# Patient Record
Sex: Male | Born: 1990 | Race: Black or African American | Hispanic: No | Marital: Single | Smoking: Never smoker
Health system: Southern US, Community
[De-identification: ages and names within clinical notes are randomized; demographics above are authoritative.]

## PROBLEM LIST (undated history)

## (undated) DIAGNOSIS — F419 Anxiety disorder, unspecified: Secondary | ICD-10-CM

## (undated) HISTORY — PX: NO PAST SURGERIES: SHX2092

---
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
## 2008-04-03 ENCOUNTER — Emergency Department: Payer: Self-pay

## 2008-06-25 ENCOUNTER — Emergency Department: Payer: Self-pay | Admitting: Emergency Medicine

## 2008-07-02 ENCOUNTER — Emergency Department: Payer: Self-pay | Admitting: Emergency Medicine

## 2009-03-22 ENCOUNTER — Emergency Department: Payer: Self-pay | Admitting: Emergency Medicine

## 2009-03-22 ENCOUNTER — Emergency Department: Payer: Self-pay | Admitting: Unknown Physician Specialty

## 2010-03-23 ENCOUNTER — Emergency Department: Payer: Self-pay | Admitting: Emergency Medicine

## 2011-10-29 ENCOUNTER — Emergency Department: Payer: Self-pay | Admitting: Emergency Medicine

## 2013-12-09 ENCOUNTER — Emergency Department: Payer: Self-pay | Admitting: Emergency Medicine

## 2013-12-24 ENCOUNTER — Emergency Department: Payer: Self-pay | Admitting: Emergency Medicine

## 2014-02-16 ENCOUNTER — Emergency Department: Payer: Self-pay | Admitting: Emergency Medicine

## 2015-06-07 ENCOUNTER — Encounter: Payer: Self-pay | Admitting: *Deleted

## 2015-06-07 ENCOUNTER — Ambulatory Visit
Admission: EM | Admit: 2015-06-07 | Discharge: 2015-06-07 | Disposition: A | Discharge status: Home / Self Care | Payer: Self-pay | Attending: Family Medicine | Admitting: Family Medicine

## 2015-06-07 ENCOUNTER — Emergency Department
Admission: EM | Admit: 2015-06-07 | Discharge: 2015-06-07 | Discharge status: Against Medical Advice | Payer: Self-pay | Attending: Emergency Medicine | Admitting: Emergency Medicine

## 2015-06-07 DIAGNOSIS — J039 Acute tonsillitis, unspecified: Secondary | ICD-10-CM

## 2015-06-07 DIAGNOSIS — R1310 Dysphagia, unspecified: Secondary | ICD-10-CM | POA: Insufficient documentation

## 2015-06-07 DIAGNOSIS — R0989 Other specified symptoms and signs involving the circulatory and respiratory systems: Secondary | ICD-10-CM | POA: Insufficient documentation

## 2015-06-07 DIAGNOSIS — H6592 Unspecified nonsuppurative otitis media, left ear: Secondary | ICD-10-CM

## 2015-06-07 DIAGNOSIS — H66001 Acute suppurative otitis media without spontaneous rupture of ear drum, right ear: Secondary | ICD-10-CM

## 2015-06-07 LAB — CBC WITH DIFFERENTIAL/PLATELET
BASOS ABS: 0.1 10*3/uL (ref 0–0.1)
BASOS PCT: 1 %
EOS ABS: 0 10*3/uL (ref 0–0.7)
Eosinophils Relative: 0 %
HEMATOCRIT: 48.7 % (ref 40.0–52.0)
HEMOGLOBIN: 16.3 g/dL (ref 13.0–18.0)
Lymphocytes Relative: 10 %
Lymphs Abs: 1.7 10*3/uL (ref 1.0–3.6)
MCH: 26.9 pg (ref 26.0–34.0)
MCHC: 33.5 g/dL (ref 32.0–36.0)
MCV: 80.4 fL (ref 80.0–100.0)
Monocytes Absolute: 1.5 10*3/uL — ABNORMAL HIGH (ref 0.2–1.0)
Monocytes Relative: 9 %
NEUTROS ABS: 14.3 10*3/uL — AB (ref 1.4–6.5)
NEUTROS PCT: 80 %
Platelets: 258 10*3/uL (ref 150–440)
RBC: 6.05 MIL/uL — AB (ref 4.40–5.90)
RDW: 13.2 % (ref 11.5–14.5)
WBC: 17.7 10*3/uL — AB (ref 3.8–10.6)

## 2015-06-07 LAB — MONONUCLEOSIS SCREEN: MONO SCREEN: NEGATIVE

## 2015-06-07 LAB — RAPID STREP SCREEN (MED CTR MEBANE ONLY): STREPTOCOCCUS, GROUP A SCREEN (DIRECT): NEGATIVE

## 2015-06-07 MED ORDER — AMOXICILLIN-POT CLAVULANATE 400-57 MG/5ML PO SUSR: 875.0000 mg | Freq: Two times a day (BID) | ORAL | Status: AC

## 2015-06-07 MED ORDER — IBUPROFEN 800 MG PO TABS: 800.0000 mg | ORAL_TABLET | Freq: Three times a day (TID) | ORAL | Status: AC | PRN

## 2015-06-07 MED ORDER — PREDNISONE 50 MG PO TABS: 50.0000 mg | ORAL_TABLET | Freq: Every day | ORAL | Status: DC

## 2015-06-07 MED ORDER — DIPHENHYDRAMINE HCL 12.5 MG/5ML PO SYRP: 25.0000 mg | ORAL_SOLUTION | Freq: Four times a day (QID) | ORAL | Status: AC | PRN

## 2015-06-07 MED ORDER — DIPHENHYDRAMINE HCL 12.5 MG/5ML PO ELIX
25.0000 mg | ORAL_SOLUTION | Freq: Once | ORAL | Status: AC
  Administered 2015-06-07: 25 mg via ORAL

## 2015-06-07 MED ORDER — METHYLPREDNISOLONE SODIUM SUCC 125 MG IJ SOLR
125.0000 mg | Freq: Once | INTRAMUSCULAR | Status: AC
  Administered 2015-06-07: 125 mg via INTRAMUSCULAR

## 2015-06-07 MED ORDER — PREDNISONE 50 MG PO TABS: 50.0000 mg | ORAL_TABLET | Freq: Every day | ORAL | Status: AC

## 2015-06-07 MED ORDER — ACETAMINOPHEN 500 MG PO TABS: 1000.0000 mg | ORAL_TABLET | Freq: Four times a day (QID) | ORAL | Status: AC | PRN

## 2015-06-07 MED ORDER — IBUPROFEN 800 MG PO TABS: 800.0000 mg | ORAL_TABLET | Freq: Three times a day (TID) | ORAL | Status: DC | PRN

## 2015-06-07 MED ORDER — ACETAMINOPHEN 500 MG PO TABS: 1000.0000 mg | ORAL_TABLET | Freq: Four times a day (QID) | ORAL | Status: DC | PRN

## 2015-06-07 MED ORDER — AMOXICILLIN-POT CLAVULANATE 400-57 MG/5ML PO SUSR: 875.0000 mg | Freq: Two times a day (BID) | ORAL | Status: DC

## 2015-06-07 MED ORDER — IBUPROFEN 100 MG/5ML PO SUSP
800.0000 mg | Freq: Once | ORAL | Status: AC
  Administered 2015-06-07: 800 mg via ORAL

## 2015-06-07 MED ORDER — DIPHENHYDRAMINE HCL 12.5 MG/5ML PO SYRP: 25.0000 mg | ORAL_SOLUTION | Freq: Four times a day (QID) | ORAL | Status: DC | PRN

## 2015-06-07 NOTE — Discharge Instructions (Signed)
Otitis Media With Effusion Otitis media with effusion is the presence of fluid in the middle ear. This is a common problem in children, which often follows ear infections. It may be present for weeks or longer after the infection. Unlike an acute ear infection, otitis media with effusion refers only to fluid behind the ear drum and not infection. Children with repeated ear and sinus infections and allergy problems are the most likely to get otitis media with effusion. CAUSES  The most frequent cause of the fluid buildup is dysfunction of the eustachian tubes. These are the tubes that drain fluid in the ears to the back of the nose (nasopharynx). SYMPTOMS   The main symptom of this condition is hearing loss. As a result, you or your child may:  Listen to the TV at a loud volume.  Not respond to questions.  Ask "what" often when spoken to.  Mistake or confuse one sound or word for another.  There may be a sensation of fullness or pressure but usually not pain. DIAGNOSIS   Your health care provider will diagnose this condition by examining you or your child's ears.  Your health care provider may test the pressure in you or your child's ear with a tympanometer.  A hearing test may be conducted if the problem persists. TREATMENT   Treatment depends on the duration and the effects of the effusion.  Antibiotics, decongestants, nose drops, and cortisone-type drugs (tablets or nasal spray) may not be helpful.  Children with persistent ear effusions may have delayed language or behavioral problems. Children at risk for developmental delays in hearing, learning, and speech may require referral to a specialist earlier than children not at risk.  You or your child's health care provider may suggest a referral to an ear, nose, and throat surgeon for treatment. The following may help restore normal hearing:  Drainage of fluid.  Placement of ear tubes (tympanostomy tubes).  Removal of adenoids  (adenoidectomy). HOME CARE INSTRUCTIONS   Avoid secondhand smoke.  Infants who are breastfed are less likely to have this condition.  Avoid feeding infants while they are lying flat.  Avoid known environmental allergens.  Avoid people who are sick. SEEK MEDICAL CARE IF:   Hearing is not better in 3 months.  Hearing is worse.  Ear pain.  Drainage from the ear.  Dizziness. MAKE SURE YOU:   Understand these instructions.  Will watch your condition.  Will get help right away if you are not doing well or get worse.   This information is not intended to replace advice given to you by your health care provider. Make sure you discuss any questions you have with your health care provider.   Document Released: 08/18/2004 Document Revised: 08/01/2014 Document Reviewed: 02/05/2013 Elsevier Interactive Patient Education 2016 [employer] Otitis Media With Effusion Otitis media with effusion is the presence of fluid in the middle ear. This is a common problem in children, which often follows ear infections. It may be present for weeks or longer after the infection. Unlike an acute ear infection, otitis media with effusion refers only to fluid behind the ear drum and not infection. Children with repeated ear and sinus infections and allergy problems are the most likely to get otitis media with effusion. CAUSES  The most frequent cause of the fluid buildup is dysfunction of the eustachian tubes. These are the tubes that drain fluid in the ears to the back of the nose (nasopharynx). SYMPTOMS   The main symptom of  this condition is hearing loss. As a result, you or your child may:  Listen to the TV at a loud volume.  Not respond to questions.  Ask "what" often when spoken to.  Mistake or confuse one sound or word for another.  There may be a sensation of fullness or pressure but usually not pain. DIAGNOSIS   Your health care provider will diagnose this condition by examining you  or your child's ears.  Your health care provider may test the pressure in you or your child's ear with a tympanometer.  A hearing test may be conducted if the problem persists. TREATMENT   Treatment depends on the duration and the effects of the effusion.  Antibiotics, decongestants, nose drops, and cortisone-type drugs (tablets or nasal spray) may not be helpful.  Children with persistent ear effusions may have delayed language or behavioral problems. Children at risk for developmental delays in hearing, learning, and speech may require referral to a specialist earlier than children not at risk.  You or your child's health care provider may suggest a referral to an ear, nose, and throat surgeon for treatment. The following may help restore normal hearing:  Drainage of fluid.  Placement of ear tubes (tympanostomy tubes).  Removal of adenoids (adenoidectomy). HOME CARE INSTRUCTIONS   Avoid secondhand smoke.  Infants who are breastfed are less likely to have this condition.  Avoid feeding infants while they are lying flat.  Avoid known environmental allergens.  Avoid people who are sick. SEEK MEDICAL CARE IF:   Hearing is not better in 3 months.  Hearing is worse.  Ear pain.  Drainage from the ear.  Dizziness. MAKE SURE YOU:   Understand these instructions.  Will watch your condition.  Will get help right away if you are not doing well or get worse.   This information is not intended to replace advice given to you by your health care provider. Make sure you discuss any questions you have with your health care provider.   Document Released: 08/18/2004 Document Revised: 08/01/2014 Document Reviewed: 02/05/2013 Elsevier Interactive Patient Education 2016 [employer] Peritonsillar Abscess A peritonsillar abscess is a collection of yellowish-white fluid (pus) in the back of the throat behind the tonsils. It usually occurs when an infection of the throat or tonsils  (tonsillitis) spreads into the tissues around the tonsils. CAUSES The infection that leads to a peritonsillar abscess is usually caused by streptococcal bacteria.  SIGNS AND SYMPTOMS  Sore throat, often with pain on just one side.  Swelling and tenderness of the glands (lymph nodes) in the neck.  Difficulty swallowing.  Difficulty opening your mouth.  Fever.  Chills.  Drooling because of difficulty swallowing saliva.  Headache.  Changes in your voice.  Bad breath. DIAGNOSIS Your health care provider will take your medical history and do a physical exam. Imaging tests may be done, such as an ultrasound or CT scan. A sample of pus may be removed from the abscess using a needle (needle aspiration) or by swabbing the back of your throat. This sample will be sent to a lab for testing. TREATMENT Treatment usually involves draining the pus from the abscess. This may be done through needle aspiration or by making an incision in the abscess. You will also likely need to take antibiotic medicine. HOME CARE INSTRUCTIONS  Rest as much as possible and get plenty of sleep.  Take medicines only as directed by your health care provider.  If you were prescribed an antibiotic medicine, finish it  all even if you start to feel better.  If your abscess was drained by your health care provider, gargle with a mixture of salt and warm water:  Mix 1 tsp of salt in 8 oz of warm water.  Gargle with this mixture four times per day or as needed for comfort.  Do not swallow this mixture.  Drink plenty of fluids.  While your throat is sore, eat soft or liquid foods, such as frozen ice pops and ice cream.  Keep all follow-up visits as directed by your health care provider. This is important. SEEK MEDICAL CARE IF:  You have increased pain, swelling, redness, or drainage in your throat.  You develop a headache, a lack of energy (lethargy), or generalized feelings of illness.  You have a  fever.  You feel dizzy.  You have difficulty swallowing or eating.  You show signs of becoming dehydrated, such as:  Light-headedness when standing.  Decreased urine output.  A fast heart rate.  Dry mouth. SEEK IMMEDIATE MEDICAL CARE IF:   You have difficulty talking or breathing, or you find it easier to breathe when you lean forward.  You are coughing up blood or vomiting blood.  You have severe throat pain that is not helped by medicines.  You start to drool.   This information is not intended to replace advice given to you by your health care provider. Make sure you discuss any questions you have with your health care provider.   Document Released: 07/11/2005 Document Revised: 08/01/2014 Document Reviewed: 02/24/2014 Elsevier Interactive Patient Education 2016 [employer] Tonsillitis Tonsillitis is an infection of the throat that causes the tonsils to become red, tender, and swollen. Tonsils are collections of lymphoid tissue at the back of the throat. Each tonsil has crevices (crypts). Tonsils help fight nose and throat infections and keep infection from spreading to other parts of the body for the first 18 months of life.  CAUSES Sudden (acute) tonsillitis is usually caused by infection with streptococcal bacteria. Long-lasting (chronic) tonsillitis occurs when the crypts of the tonsils become filled with pieces of food and bacteria, which makes it easy for the tonsils to become repeatedly infected. SYMPTOMS  Symptoms of tonsillitis include:  A sore throat, with possible difficulty swallowing.  White patches on the tonsils.  Fever.  Tiredness.  New episodes of snoring during sleep, when you did not snore before.  Small, foul-smelling, yellowish-white pieces of material (tonsilloliths) that you occasionally cough up or spit out. The tonsilloliths can also cause you to have bad breath. DIAGNOSIS Tonsillitis can be diagnosed through a physical exam. Diagnosis can  be confirmed with the results of lab tests, including a throat culture. TREATMENT  The goals of tonsillitis treatment include the reduction of the severity and duration of symptoms and prevention of associated conditions. Symptoms of tonsillitis can be improved with the use of steroids to reduce the swelling. Tonsillitis caused by bacteria can be treated with antibiotic medicines. Usually, treatment with antibiotic medicines is started before the cause of the tonsillitis is known. However, if it is determined that the cause is not bacterial, antibiotic medicines will not treat the tonsillitis. If attacks of tonsillitis are severe and frequent, your health care provider may recommend surgery to remove the tonsils (tonsillectomy). HOME CARE INSTRUCTIONS   Rest as much as possible and get plenty of sleep.  Drink plenty of fluids. While the throat is very sore, eat soft foods or liquids, such as sherbet, soups, or instant breakfast drinks.  Eat frozen ice pops.  Gargle with a warm or cold liquid to help soothe the throat. Mix 1/4 teaspoon of salt and 1/4 teaspoon of baking soda in 8 oz of water. SEEK MEDICAL CARE IF:   Large, tender lumps develop in your neck.  A rash develops.  A green, yellow-brown, or bloody substance is coughed up.  You are unable to swallow liquids or food for 24 hours.  You notice that only one of the tonsils is swollen. SEEK IMMEDIATE MEDICAL CARE IF:   You develop any new symptoms such as vomiting, severe headache, stiff neck, chest pain, or trouble breathing or swallowing.  You have severe throat pain along with drooling or voice changes.  You have severe pain, unrelieved with recommended medications.  You are unable to fully open the mouth.  You develop redness, swelling, or severe pain anywhere in the neck.  You have a fever. MAKE SURE YOU:   Understand these instructions.  Will watch your condition.  Will get help right away if you are not doing well  or get worse.   This information is not intended to replace advice given to you by your health care provider. Make sure you discuss any questions you have with your health care provider.   Document Released: 04/20/2005 Document Revised: 08/01/2014 Document Reviewed: 12/28/2012 Elsevier Interactive Patient Education [employer]

## 2015-06-07 NOTE — ED Notes (Signed)
Pt presents w/ c/o foreign body sensation at the back of his tongue. Pt states about 45 minutes ago he ate a donut and felt as if it got stuck in his throat. Pt denies sxs of an allergic reaction at this time. Pt is clearing his throat frequently, but is managing secretions w/o difficulty. Pt in no observable respiratory distress at this time and is somewhat somnolent in triage.

## 2015-06-07 NOTE — ED Notes (Signed)
Patient states that 2 days ago he started having throat pain. Patient states that he went to ER last night but, was so sleepy he left without being seen. Patient presents today with a swollen throat with visible white exudate and unbearable pain. States that when he lays back he has the worst pain. Patients right side of throat is significantly bigger then left side.

## 2015-06-07 NOTE — ED Provider Notes (Signed)
CSN: [id number]     Arrival date & time 06/07/15  1103 History   First MD Initiated Contact with Patient 06/07/15 1325     Chief Complaint  Patient presents with  . Sore Throat   (Consider location/radiation/quality/duration/timing/severity/associated sxs/prior Treatment) HPI Comments: Married african [place] male here for evaluation of throat pain, enlarged tonsils started 2 days ago when to ER when pain got really bad this am but left when not seen after a couple hours and came here for evaluation.  Tried aleve when first started didn't help.  Right ear pain too.  Denied previous strep/tonsil problems.  Denied others sick at home or work.  Patient is a 24 y.o. male presenting with pharyngitis. The history is provided by the patient.  Sore Throat This is a new problem. The current episode started 2 days ago. The problem occurs constantly. The problem has been rapidly worsening. Pertinent negatives include no chest pain, no abdominal pain, no headaches and no shortness of breath. The symptoms are aggravated by eating, swallowing, drinking and coughing. Nothing relieves the symptoms. Treatments tried: aleve. The treatment provided no relief.    No past medical history on file. Past Surgical History  Procedure Laterality Date  . No past surgeries     Family History  Problem Relation Age of Onset  . Colon cancer Mother 1857  . Lung cancer Father 660   Social History  Substance Use Topics  . Smoking status: Never Smoker   . Smokeless tobacco: Never Used  . Alcohol Use: 0.0 oz/week    0 Standard drinks or equivalent per week     Comment: Occasional     Review of Systems  Constitutional: Positive for chills and appetite change. Negative for fever, diaphoresis, activity change, fatigue and unexpected weight change.  HENT: Positive for congestion, sinus pressure and sore throat. Negative for dental problem, drooling, ear discharge, ear pain, facial swelling, hearing loss, mouth sores,  nosebleeds, postnasal drip, rhinorrhea, sneezing, tinnitus, trouble swallowing and voice change.   Eyes: Negative for photophobia, pain, discharge, redness, itching and visual disturbance.  Respiratory: Negative for cough, choking, chest tightness, shortness of breath, wheezing and stridor.   Cardiovascular: Negative for chest pain, palpitations and leg swelling.  Gastrointestinal: Negative for nausea, vomiting, abdominal pain, diarrhea, constipation, blood in stool and abdominal distention.  Endocrine: Negative for cold intolerance and heat intolerance.  Genitourinary: Negative for dysuria.  Musculoskeletal: Negative for myalgias, back pain, joint swelling, arthralgias, gait problem, neck pain and neck stiffness.  Skin: Negative for color change, pallor, rash and wound.  Allergic/Immunologic: Negative for environmental allergies, food allergies and immunocompromised state.  Neurological: Negative for dizziness, tremors, seizures, syncope, facial asymmetry, speech difficulty, weakness, light-headedness, numbness and headaches.  Hematological: Negative for adenopathy. Does not bruise/bleed easily.  Psychiatric/Behavioral: Negative for behavioral problems, confusion, sleep disturbance and agitation.    Allergies  Review of patient's allergies indicates no known allergies.  Home Medications   Prior to Admission medications   Medication Sig Start Date End Date Taking? Authorizing Provider  acetaminophen (TYLENOL) 500 MG tablet Take 2 tablets (1,000 mg total) by mouth every 6 (six) hours as needed for mild pain, moderate pain, fever or headache. 06/07/15 06/14/15  Barbaraann Barthelina A Blanch Stang, NP  amoxicillin-clavulanate (AUGMENTIN) 400-57 MG/5ML suspension Take 10.9 mLs (872 mg total) by mouth 2 (two) times daily. 06/07/15 06/17/15  Barbaraann Barthelina A Kamdyn Colborn, NP  diphenhydrAMINE (BENYLIN) 12.5 MG/5ML syrup Take 10 mLs (25 mg total) by mouth 4 (four) times daily as needed. 06/07/15  Barbaraann Barthel, NP    ibuprofen (ADVIL,MOTRIN) 800 MG tablet Take 1 tablet (800 mg total) by mouth every 8 (eight) hours as needed for fever, headache, mild pain or moderate pain. 06/07/15 06/14/15  Barbaraann Barthel, NP  predniSONE (DELTASONE) 50 MG tablet Take 1 tablet (50 mg total) by mouth daily with breakfast. 06/08/15 06/12/15  Barbaraann Barthel, NP   Meds Ordered and Administered this Visit   Medications  diphenhydrAMINE (BENADRYL) 12.5 MG/5ML elixir 25 mg (25 mg Oral Given 06/07/15 1347)  ibuprofen (ADVIL,MOTRIN) 100 MG/5ML suspension 800 mg (800 mg Oral Given 06/07/15 1348)  methylPREDNISolone sodium succinate (SOLU-MEDROL) 125 mg/2 mL injection 125 mg (125 mg Intramuscular Given 06/07/15 1348)    BP 145/88 mmHg  Pulse 77  Temp(Src) 98.9 F (37.2 C) (Tympanic)  Resp 174  Ht 5\' 10"  (1.778 m)  Wt 220 lb (99.791 kg)  BMI 31.57 kg/m2  SpO2 100% No data found.   Physical Exam  Constitutional: He is oriented to person, place, and time. Vital signs are normal. He appears well-developed and well-nourished. He is active and cooperative.  Non-toxic appearance. He does not have a sickly appearance. He appears ill. No distress.  HENT:  Head: Normocephalic and atraumatic.  Right Ear: Hearing, external ear and ear canal normal. Tympanic membrane is injected, erythematous and bulging. A middle ear effusion is present.  Left Ear: Hearing, external ear and ear canal normal. A middle ear effusion is present.  Nose: Mucosal edema and rhinorrhea present. No nose lacerations, sinus tenderness, nasal deformity, septal deviation or nasal septal hematoma. No epistaxis.  No foreign bodies. Right sinus exhibits maxillary sinus tenderness and frontal sinus tenderness. Left sinus exhibits maxillary sinus tenderness and frontal sinus tenderness.  Mouth/Throat: Uvula is midline. Mucous membranes are not pale, dry and not cyanotic. He does not have dentures. No oral lesions. No trismus in the jaw. Normal dentition. No dental  abscesses, uvula swelling, lacerations or dental caries. Oropharyngeal exudate, posterior oropharyngeal edema and posterior oropharyngeal erythema present.    Mucous membranes dry, right TM edema/erythema air fluid level with opacity  Eyes: Conjunctivae, EOM and lids are normal. Pupils are equal, round, and reactive to light. Right eye exhibits no chemosis, no discharge, no exudate and no hordeolum. No foreign body present in the right eye. Left eye exhibits no chemosis, no discharge, no exudate and no hordeolum. No foreign body present in the left eye. Right conjunctiva is not injected. Right conjunctiva has no hemorrhage. Left conjunctiva is not injected. Left conjunctiva has no hemorrhage. No scleral icterus. Right eye exhibits normal extraocular motion and no nystagmus. Left eye exhibits normal extraocular motion and no nystagmus. Right pupil is round and reactive. Left pupil is round and reactive. Pupils are equal.  Neck: Trachea normal and normal range of motion. Neck supple. No tracheal tenderness, no spinous process tenderness and no muscular tenderness present. No rigidity. No tracheal deviation, no edema, no erythema and normal range of motion present. No thyroid mass and no thyromegaly present.  Cardiovascular: Normal rate, regular rhythm, S1 normal, S2 normal, normal heart sounds and intact distal pulses.  PMI is not displaced.  Exam reveals no gallop and no friction rub.   No murmur heard. Pulses:      Radial pulses are 2+ on the right side, and 2+ on the left side.  Pulmonary/Chest: Effort normal and breath sounds normal. No accessory muscle usage or stridor. No respiratory distress. He has no decreased breath sounds. He has no wheezes.  He has no rhonchi. He has no rales.  Abdominal: Soft. He exhibits no distension.  Musculoskeletal: Normal range of motion. He exhibits no edema or tenderness.       Right shoulder: Normal.       Left shoulder: Normal.       Right hip: Normal.       Left  hip: Normal.       Right knee: Normal.       Left knee: Normal.       Cervical back: Normal.       Thoracic back: Normal.       Lumbar back: Normal.       Right hand: Normal.       Left hand: Normal.  Lymphadenopathy:       Head (right side): No submental, no submandibular, no tonsillar, no preauricular, no posterior auricular and no occipital adenopathy present.       Head (left side): No submental, no submandibular, no tonsillar, no preauricular, no posterior auricular and no occipital adenopathy present.    He has no cervical adenopathy.       Right cervical: No superficial cervical, no deep cervical and no posterior cervical adenopathy present.      Left cervical: No superficial cervical, no deep cervical and no posterior cervical adenopathy present.  Neurological: He is alert and oriented to person, place, and time. He displays no atrophy and no tremor. No cranial nerve deficit or sensory deficit. He exhibits normal muscle tone. He displays no seizure activity. Coordination and gait normal. GCS eye subscore is 4. GCS verbal subscore is 5. GCS motor subscore is 6.  Skin: Skin is warm, dry and intact. No abrasion, no bruising, no burn, no ecchymosis, no laceration, no lesion, no petechiae and no rash noted. He is not diaphoretic. No cyanosis or erythema. No pallor. Nails show no clubbing.  Psychiatric: He has a normal mood and affect. His speech is normal and behavior is normal. Judgment and thought content normal. Cognition and memory are normal.  Nursing note and vitals reviewed.   ED Course  Procedures (including critical care time)  Labs Review Labs Reviewed  CBC WITH DIFFERENTIAL/PLATELET - Abnormal; Notable for the following:    WBC 17.7 (*)    RBC 6.05 (*)    Neutro Abs 14.3 (*)    Monocytes Absolute 1.5 (*)    All other components within normal limits  RAPID STREP SCREEN (NOT AT [facility])  CULTURE, GROUP A STREP ([facility] ONLY)  MONONUCLEOSIS SCREEN    Imaging Review No  results found.  [id number] Discussed with patient rapid strep negative.  Throat culture pending typically 48 hours will call with results.  If dyspnea, dysphagia call 911/go to ER for re-evaluation.  Solumedrol, motrin and benadryl ordered for patient administration along with sucking on ice chips.  Patient feels unable to swallow pills at this time.  1406 solumedrol administered by CMA camille parker at 1347 2ml IM.  patient feeling a little better with ice chips and after motrin, solumedrol and benadryl administration.  Pending CBC and mono test draw.  Patient sucking on ice chips lying on exam table.  Cousin driving him home.  Patient agreed with plan of care and had no further questions at this time.   1500 Discussed WBC elevated with patient and mono test negative but still recommended no oral intimate contact with girlfriend.  His speech better wants to go home ate whole cup ice chips.  Discussed discharge instructions with girlfriend and  patient.  May take tylenol if he feels he needs it anytime then q6h.  Next motrin in 7h. Benadryl liquid in 6 hours. Prednisone start in am may crush pills.  Start antibiotic as soon as you pick up today.  Popsicles, clear liquids, cool fluids today no crunchy/hard foods until throat improved/bland diet. Reiterated again if difficulty swallowing/wheezing/unable to swallow spit to ER for immediate re-evaluation. Patient and girlfriend verbalized understanding of information/instructions, agreed with plan of care and had no further questions at this time.  Filed Vitals:   06/07/15 1158  BP: 135/95  Pulse:   Temp:   Resp:   No data found.  MDM   1. Tonsillitis with exudate   2. Acute suppurative otitis media of right ear without spontaneous rupture of tympanic membrane, recurrence not specified   3. Otitis media with effusion, left    Prednisone  po daily x 5 days start tomorrow.  Benadryl/tylenol/motrin.  School/work excuse note given to patient for 48  hours.  Usually no specific medical treatment is needed if a virus is causing the sore throat.  The throat most often gets better on its own within 5 to 7 days.  Antibiotic medicine does not cure viral pharyngitis.   For acute pharyngitis caused by bacteria, your healthcare provider will prescribe an antibiotic.  Marland Kitchen Do not smoke.  Marland Kitchen Avoid secondhand smoke and other air pollutants.  . Use a cool mist humidifier to add moisture to the air.  . Get plenty of rest.  . You may want to rest your throat by talking less and eating a diet that is mostly liquid or soft for a day or two.   Marland Kitchen Nonprescription throat lozenges and mouthwashes should help relieve the soreness.   . Gargling with warm saltwater and drinking warm liquids may help.  (You can make a saltwater solution by adding 1/4 teaspoon of salt to 8 ounces, or 240 mL, of warm water.)  . A nonprescription pain reliever such as aspirin, acetaminophen, or ibuprofen may ease general aches and pains.   FOLLOW UP with clinic provider if no improvements in the next 7-10 days.  Patient verbalized understanding of instructions and agreed with plan of care. P2:  Hand washing and diet.  Augmentin  po BID x 10 days Rx given.  Treatment as ordered.  Symptomatic therapy suggested fluids, NSAIDs and rest.  May take Tylenol or Motrin for fevers.  Call or return to clinic as needed if these symptoms worsen or fail to improve as anticipated. Exitcare handout on otitis media given to patient.  Patient verbalized agreement and understanding of treatment plan.   P2:  Hand washing  Supportive treatment.   No evidence of invasive bacterial infection, non toxic and well hydrated.  This is most likely self limiting viral infection.  I do not see where any further testing or imaging is necessary at this time.   I will suggest supportive care, rest, good hygiene and encourage the patient to take adequate fluids.  The patient is to return to clinic or EMERGENCY ROOM if  symptoms worsen or change significantly e.g. ear pain, fever, purulent discharge from ears or bleeding.  Exitcare handout on otitis media with effusion given to patient.  Patient verbalized agreement and understanding of treatment plan.     Barbaraann Barthel, NP 06/08/15 1302

## 2015-06-09 LAB — CULTURE, GROUP A STREP (THRC)

## 2015-07-31 ENCOUNTER — Ambulatory Visit: Admission: EM | Admit: 2015-07-31 | Discharge: 2015-07-31 | Discharge status: Absent without leave | Payer: Self-pay

## 2015-08-27 ENCOUNTER — Emergency Department: Admission: EM | Admit: 2015-08-27 | Discharge: 2015-08-27 | Discharge status: Absent without leave | Payer: Self-pay

## 2016-05-23 ENCOUNTER — Encounter: Payer: Self-pay | Admitting: Emergency Medicine

## 2016-05-23 ENCOUNTER — Emergency Department
Admission: EM | Admit: 2016-05-23 | Discharge: 2016-05-23 | Disposition: A | Discharge status: Home / Self Care | Payer: No Typology Code available for payment source | Attending: Emergency Medicine | Admitting: Emergency Medicine

## 2016-05-23 DIAGNOSIS — M7918 Myalgia, other site: Secondary | ICD-10-CM

## 2016-05-23 DIAGNOSIS — Y92410 Unspecified street and highway as the place of occurrence of the external cause: Secondary | ICD-10-CM | POA: Insufficient documentation

## 2016-05-23 DIAGNOSIS — Y999 Unspecified external cause status: Secondary | ICD-10-CM | POA: Diagnosis not present

## 2016-05-23 DIAGNOSIS — Y9389 Activity, other specified: Secondary | ICD-10-CM | POA: Insufficient documentation

## 2016-05-23 DIAGNOSIS — M79662 Pain in left lower leg: Secondary | ICD-10-CM | POA: Insufficient documentation

## 2016-05-23 DIAGNOSIS — M545 Low back pain: Secondary | ICD-10-CM | POA: Diagnosis not present

## 2016-05-23 MED ORDER — IBUPROFEN 600 MG PO TABS
600.0000 mg | ORAL_TABLET | Freq: Once | ORAL | Status: AC
  Administered 2016-05-23: 600 mg via ORAL
  Filled 2016-05-23: qty 1

## 2016-05-23 MED ORDER — NAPHAZOLINE-PHENIRAMINE 0.025-0.3 % OP SOLN: 1.0000 [drp] | Freq: Four times a day (QID) | OPHTHALMIC | 0 refills | Status: AC | PRN

## 2016-05-23 MED ORDER — CYCLOBENZAPRINE HCL 10 MG PO TABS: 10.0000 mg | ORAL_TABLET | Freq: Three times a day (TID) | ORAL | 0 refills | Status: AC | PRN

## 2016-05-23 MED ORDER — CYCLOBENZAPRINE HCL 10 MG PO TABS
10.0000 mg | ORAL_TABLET | Freq: Once | ORAL | Status: AC
  Administered 2016-05-23: 10 mg via ORAL
  Filled 2016-05-23: qty 1

## 2016-05-23 MED ORDER — IBUPROFEN 600 MG PO TABS: 600.0000 mg | ORAL_TABLET | Freq: Three times a day (TID) | ORAL | 0 refills | Status: AC | PRN

## 2016-05-23 MED ORDER — OXYCODONE-ACETAMINOPHEN 5-325 MG PO TABS: 1.0000 | ORAL_TABLET | ORAL | 0 refills | Status: DC | PRN

## 2016-05-23 MED ORDER — OXYCODONE-ACETAMINOPHEN 5-325 MG PO TABS
1.0000 | ORAL_TABLET | Freq: Once | ORAL | Status: AC
  Administered 2016-05-23: 1 via ORAL
  Filled 2016-05-23: qty 1
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
## 2016-05-25 ENCOUNTER — Encounter (HOSPITAL_COMMUNITY): Payer: Self-pay | Admitting: *Deleted

## 2016-05-25 ENCOUNTER — Emergency Department (HOSPITAL_COMMUNITY): Payer: No Typology Code available for payment source

## 2016-05-25 ENCOUNTER — Emergency Department (HOSPITAL_COMMUNITY)
Admission: EM | Admit: 2016-05-25 | Discharge: 2016-05-25 | Disposition: A | Discharge status: Home / Self Care | Payer: No Typology Code available for payment source | Attending: Emergency Medicine | Admitting: Emergency Medicine

## 2016-05-25 DIAGNOSIS — S0993XA Unspecified injury of face, initial encounter: Secondary | ICD-10-CM | POA: Insufficient documentation

## 2016-05-25 DIAGNOSIS — Y92410 Unspecified street and highway as the place of occurrence of the external cause: Secondary | ICD-10-CM | POA: Diagnosis not present

## 2016-05-25 DIAGNOSIS — Y939 Activity, unspecified: Secondary | ICD-10-CM | POA: Diagnosis not present

## 2016-05-25 DIAGNOSIS — M545 Low back pain: Secondary | ICD-10-CM | POA: Insufficient documentation

## 2016-05-25 DIAGNOSIS — Y999 Unspecified external cause status: Secondary | ICD-10-CM | POA: Diagnosis not present

## 2016-05-25 MED ORDER — KETOROLAC TROMETHAMINE 60 MG/2ML IM SOLN
10.0000 mg | Freq: Once | INTRAMUSCULAR | Status: AC
  Administered 2016-05-25: 9.9 mg via INTRAMUSCULAR
  Filled 2016-05-25: qty 2
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
## 2016-05-25 NOTE — ED Notes (Signed)
Pt returned to room  

## 2016-05-25 NOTE — ED Notes (Signed)
Declined W/C at D/C and was escorted to lobby by RN. 
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
## 2016-05-25 NOTE — ED Notes (Signed)
Pt in gown, vitals updated, ready to be seen.

## 2016-06-18 ENCOUNTER — Emergency Department (HOSPITAL_COMMUNITY)
Admission: EM | Admit: 2016-06-18 | Discharge: 2016-06-18 | Disposition: A | Discharge status: Home / Self Care | Payer: Self-pay | Attending: Emergency Medicine | Admitting: Emergency Medicine

## 2016-06-18 ENCOUNTER — Encounter (HOSPITAL_COMMUNITY): Payer: Self-pay | Admitting: Emergency Medicine

## 2016-06-18 DIAGNOSIS — F419 Anxiety disorder, unspecified: Secondary | ICD-10-CM | POA: Insufficient documentation

## 2016-06-18 DIAGNOSIS — R079 Chest pain, unspecified: Secondary | ICD-10-CM | POA: Insufficient documentation

## 2016-06-18 HISTORY — DX: Anxiety disorder, unspecified: F41.9

## 2016-06-18 MED ORDER — HYDROXYZINE HCL 25 MG PO TABS: 25.0000 mg | ORAL_TABLET | Freq: Three times a day (TID) | ORAL | 0 refills | Status: AC | PRN

## 2016-06-18 NOTE — ED Triage Notes (Addendum)
PT reports hx of anxiety and feels anxious currently. Has centralized heavy chest pain. States he has hx of panic attacks, but this doesn't feel similar. States he thinks he just needs some anxiety meds if they make those. Chest pain started last night when he started feeling anxious but states is chest pain free currently. Denies drug use.

## 2016-06-18 NOTE — ED Provider Notes (Signed)
[facility] Provider Note   CSN: [id number] Arrival date & time: 06/18/16  0830   By signing my name below, I, Clarisse GougeXavier Herndon, attest that this documentation has been prepared under the direction and in the presence of Arthor CaptainAbigail Tynasia Mccaul, PA-C. Electronically Signed: Clarisse GougeXavier Herndon, Scribe. 06/18/16. 9:37 AM.   History   Chief Complaint Chief Complaint  Patient presents with  . Anxiety   The history is provided by the patient. No language interpreter was used.   HPI Comments: Ian Weiss is a 25 y.o. male who presents to the Emergency Department complaining of episodic chest pain beginning last night. He believes his pain is secondary to anxiety. He describes his chest pain as consistent with previous panic attacks and describes it as dull and achy on the left side. He states that he was stressed out last night at the onset of his symptoms.  He further reports that his pain is alleviated by drinking water and calming himself down. Pt does not smoke.Pt denies FMHx of heart attacks, HTN, exacerbation of chest pain with exertion, SOB, nausea, diaphoresis, and other cardiac risk factors.  Past Medical History:  Diagnosis Date  . Anxiety     There are no active problems to display for this patient.   Past Surgical History:  Procedure Laterality Date  . NO PAST SURGERIES         Home Medications    Prior to Admission medications   Medication Sig Start Date End Date Taking? Authorizing Provider  cyclobenzaprine (FLEXERIL) 10 MG tablet Take 1 tablet (10 mg total) by mouth 3 (three) times daily as needed. 05/23/16   Joni Reiningonald K Smith, PA-C  diphenhydrAMINE (BENYLIN) 12.5 MG/5ML syrup Take 10 mLs (25 mg total) by mouth 4 (four) times daily as needed. 06/07/15   Barbaraann Barthelina A Betancourt, NP  ibuprofen (ADVIL,MOTRIN) 600 MG tablet Take 1 tablet (600 mg total) by mouth every 8 (eight) hours as needed. 05/23/16   Joni Reiningonald K Smith, PA-C  naphazoline-pheniramine (NAPHCON-A) 0.025-0.3 % ophthalmic  solution Place 1 drop into both eyes 4 (four) times daily as needed for irritation. 05/23/16   Joni Reiningonald K Smith, PA-C  oxyCODONE-acetaminophen (ROXICET) 5-325 MG tablet Take 1 tablet by mouth every 4 (four) hours as needed for severe pain. 05/23/16   Joni Reiningonald K Smith, PA-C    Family History Family History  Problem Relation Age of Onset  . Colon cancer Mother 3257  . Lung cancer Father 1460    Social History Social History  Substance Use Topics  . Smoking status: Never Smoker  . Smokeless tobacco: Never Used  . Alcohol use 0.0 oz/week     Comment: Occasional      Allergies   Patient has no known allergies.   Review of Systems Review of Systems  Constitutional: Negative for diaphoresis.  Respiratory: Negative for shortness of breath.   Cardiovascular: Positive for chest pain.  Gastrointestinal: Negative for nausea.  Psychiatric/Behavioral: The patient is nervous/anxious.      Physical Exam  Updated Vital Signs BP 137/97 (BP Location: Right Arm)   Pulse 80   Temp 98 F (36.7 C)   SpO2 100%   Physical Exam Physical Exam  Constitutional: Pt appears well-developed and well-nourished. No distress.  Awake, alert, nontoxic appearance  HENT:  Head: Normocephalic and atraumatic.  Mouth/Throat: Oropharynx is clear and moist. No oropharyngeal exudate.  Eyes: Conjunctivae are normal. No scleral icterus.  Neck: Normal range of motion. Neck supple.  Cardiovascular: Normal rate, regular rhythm and intact distal pulses.  Pulmonary/Chest: Effort normal and breath sounds normal. No respiratory distress. Pt has no wheezes. Chest wall non-tender. Equal chest expansion  Abdominal: Soft. Bowel sounds are normal. Pt exhibits no mass. There is no tenderness. There is no rebound and no guarding.  Musculoskeletal: Normal range of motion. Pt exhibits no edema.  Neurological: Pt is alert.  Speech is clear and goal oriented Moves extremities without ataxia  Skin: Skin is warm and dry. Pt is  not diaphoretic.  Psychiatric: Pt has a normal mood and affect.  Nursing note and vitals reviewed.    ED Treatments / Results  DIAGNOSTIC STUDIES: Oxygen Saturation is 100% on RA, normal by my interpretation.    COORDINATION OF CARE: 9:37 AM Discussed treatment plan with pt at bedside and pt agreed to plan.   Labs (all labs ordered are listed, but only abnormal results are displayed) Labs Reviewed - No data to display  EKG  EKG Interpretation None       Radiology No results found.  Procedures Procedures (including critical care time)  Medications Ordered in ED Medications - No data to display   Initial Impression / Assessment and Plan / ED Course  I have reviewed the triage vital signs and the nursing notes.  Pertinent labs & imaging results that were available during my care of the patient were reviewed by me and considered in my medical decision making (see chart for details).  Clinical Course   PERC- negative, very low risk for ACS. No active CP, negative EKG. Patient presents to the emergency department complaining of symptoms consistent with anxiety.  Patient has a history of same with similar episodes.  The patient is resting comfortably, in no apparent distress and asymptomatic.  Labs, ECG and vital signs reviewed.  Stress reducing mechanisms discussed including caffeine intake.  Patient has been referred to psychiatric services for follow-up.     Final Clinical Impressions(s) / ED Diagnoses   Final diagnoses:  Anxiety    New Prescriptions Discharge Medication List as of 06/18/2016  9:44 AM    START taking these medications   Details  hydrOXYzine (ATARAX/VISTARIL) 25 MG tablet Take 1 tablet (25 mg total) by mouth every 8 (eight) hours as needed., Starting Sat 06/18/2016, Print      I personally performed the services described in this documentation, which was scribed in my presence. The recorded information has been reviewed and is accurate.        Arthor Captainbigail Jahlani Lorentz, PA-C 06/18/16 1134    Mancel BaleElliott Wentz, MD 06/18/16 1501

## 2016-07-05 ENCOUNTER — Emergency Department
Admission: EM | Admit: 2016-07-05 | Discharge: 2016-07-05 | Disposition: A | Discharge status: Home / Self Care | Payer: Self-pay | Attending: Emergency Medicine | Admitting: Emergency Medicine

## 2016-07-05 ENCOUNTER — Emergency Department: Payer: Self-pay

## 2016-07-05 ENCOUNTER — Encounter: Payer: Self-pay | Admitting: Emergency Medicine

## 2016-07-05 DIAGNOSIS — Y929 Unspecified place or not applicable: Secondary | ICD-10-CM | POA: Insufficient documentation

## 2016-07-05 DIAGNOSIS — Y9389 Activity, other specified: Secondary | ICD-10-CM | POA: Insufficient documentation

## 2016-07-05 DIAGNOSIS — T1490XA Injury, unspecified, initial encounter: Secondary | ICD-10-CM

## 2016-07-05 DIAGNOSIS — Y999 Unspecified external cause status: Secondary | ICD-10-CM | POA: Insufficient documentation

## 2016-07-05 DIAGNOSIS — Z791 Long term (current) use of non-steroidal anti-inflammatories (NSAID): Secondary | ICD-10-CM | POA: Insufficient documentation

## 2016-07-05 DIAGNOSIS — S62639B Displaced fracture of distal phalanx of unspecified finger, initial encounter for open fracture: Secondary | ICD-10-CM

## 2016-07-05 DIAGNOSIS — Z23 Encounter for immunization: Secondary | ICD-10-CM | POA: Insufficient documentation

## 2016-07-05 DIAGNOSIS — S62637B Displaced fracture of distal phalanx of left little finger, initial encounter for open fracture: Secondary | ICD-10-CM | POA: Insufficient documentation

## 2016-07-05 DIAGNOSIS — W230XXA Caught, crushed, jammed, or pinched between moving objects, initial encounter: Secondary | ICD-10-CM | POA: Insufficient documentation

## 2016-07-05 MED ORDER — CEPHALEXIN 500 MG PO CAPS: 500.0000 mg | ORAL_CAPSULE | Freq: Two times a day (BID) | ORAL | 0 refills | Status: AC

## 2016-07-05 MED ORDER — LIDOCAINE-EPINEPHRINE 2 %-1:100000 IJ SOLN
30.0000 mL | Freq: Once | INTRAMUSCULAR | Status: AC
  Administered 2016-07-05: 30 mL via INTRADERMAL

## 2016-07-05 MED ORDER — LIDOCAINE-EPINEPHRINE (PF) 1 %-1:200000 IJ SOLN: 30.0000 mL | Freq: Once | INTRAMUSCULAR | Status: DC

## 2016-07-05 MED ORDER — LIDOCAINE HCL (PF) 1 % IJ SOLN
INTRAMUSCULAR | Status: AC
  Filled 2016-07-05: qty 10

## 2016-07-05 MED ORDER — OXYCODONE-ACETAMINOPHEN 5-325 MG PO TABS: 1.0000 | ORAL_TABLET | Freq: Four times a day (QID) | ORAL | 0 refills | Status: AC | PRN

## 2016-07-05 MED ORDER — TETANUS-DIPHTH-ACELL PERTUSSIS 5-2.5-18.5 LF-MCG/0.5 IM SUSP
0.5000 mL | Freq: Once | INTRAMUSCULAR | Status: AC
  Administered 2016-07-05: 0.5 mL via INTRAMUSCULAR
  Filled 2016-07-05: qty 0.5

## 2016-07-05 MED ORDER — SULFAMETHOXAZOLE-TRIMETHOPRIM 800-160 MG PO TABS: 1.0000 | ORAL_TABLET | Freq: Two times a day (BID) | ORAL | 0 refills | Status: AC
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
## 2016-07-05 NOTE — ED Notes (Signed)
Dr. Scotty CourtStafford at bedside suturing. Patient tolerating procedure well.
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
